# Patient Record
Sex: Male | Born: 1940 | Race: White | Hispanic: No | Marital: Married | Smoking: Never smoker
Health system: Southern US, Community
[De-identification: ages and names within clinical notes are randomized; demographics above are authoritative.]

## PROBLEM LIST (undated history)

## (undated) DIAGNOSIS — K2210 Ulcer of esophagus without bleeding: Secondary | ICD-10-CM

## (undated) DIAGNOSIS — M1990 Unspecified osteoarthritis, unspecified site: Secondary | ICD-10-CM

## (undated) DIAGNOSIS — H409 Unspecified glaucoma: Secondary | ICD-10-CM

## (undated) DIAGNOSIS — S2239XA Fracture of one rib, unspecified side, initial encounter for closed fracture: Secondary | ICD-10-CM

## (undated) DIAGNOSIS — I739 Peripheral vascular disease, unspecified: Secondary | ICD-10-CM

## (undated) DIAGNOSIS — I10 Essential (primary) hypertension: Secondary | ICD-10-CM

## (undated) DIAGNOSIS — K2950 Unspecified chronic gastritis without bleeding: Secondary | ICD-10-CM

## (undated) DIAGNOSIS — C801 Malignant (primary) neoplasm, unspecified: Secondary | ICD-10-CM

## (undated) DIAGNOSIS — R609 Edema, unspecified: Secondary | ICD-10-CM

## (undated) DIAGNOSIS — M109 Gout, unspecified: Secondary | ICD-10-CM

## (undated) DIAGNOSIS — E119 Type 2 diabetes mellitus without complications: Secondary | ICD-10-CM

## (undated) DIAGNOSIS — I639 Cerebral infarction, unspecified: Secondary | ICD-10-CM

## (undated) DIAGNOSIS — E785 Hyperlipidemia, unspecified: Secondary | ICD-10-CM

## (undated) DIAGNOSIS — K2980 Duodenitis without bleeding: Secondary | ICD-10-CM

## (undated) DIAGNOSIS — O2230 Deep phlebothrombosis in pregnancy, unspecified trimester: Secondary | ICD-10-CM

## (undated) DIAGNOSIS — E538 Deficiency of other specified B group vitamins: Secondary | ICD-10-CM

## (undated) DIAGNOSIS — K635 Polyp of colon: Secondary | ICD-10-CM

## (undated) DIAGNOSIS — N529 Male erectile dysfunction, unspecified: Secondary | ICD-10-CM

## (undated) DIAGNOSIS — S2249XA Multiple fractures of ribs, unspecified side, initial encounter for closed fracture: Secondary | ICD-10-CM

## (undated) HISTORY — PX: MOHS SURGERY: SHX181

## (undated) HISTORY — PX: CHOLECYSTECTOMY: SHX55

---

## 2004-05-29 ENCOUNTER — Ambulatory Visit: Payer: Self-pay

## 2006-07-10 ENCOUNTER — Ambulatory Visit: Payer: Self-pay | Admitting: Family Medicine

## 2008-02-18 ENCOUNTER — Ambulatory Visit: Payer: Self-pay | Admitting: Gastroenterology

## 2012-07-30 ENCOUNTER — Emergency Department: Payer: Self-pay | Admitting: Emergency Medicine

## 2012-07-30 ENCOUNTER — Ambulatory Visit: Payer: Self-pay | Admitting: Family Medicine

## 2012-07-30 LAB — CBC WITH DIFFERENTIAL/PLATELET
Basophil #: 0.1 10*3/uL (ref 0.0–0.1)
Basophil %: 0.7 %
Eosinophil %: 1.6 %
HCT: 42.2 % (ref 40.0–52.0)
HGB: 15 g/dL (ref 13.0–18.0)
Lymphocyte #: 1.3 10*3/uL (ref 1.0–3.6)
Monocyte #: 0.9 x10 3/mm (ref 0.2–1.0)
Platelet: 179 10*3/uL (ref 150–440)
RBC: 4.72 10*6/uL (ref 4.40–5.90)
RDW: 14.1 % (ref 11.5–14.5)

## 2012-07-30 LAB — COMPREHENSIVE METABOLIC PANEL
Albumin: 4.1 g/dL (ref 3.4–5.0)
Alkaline Phosphatase: 52 U/L (ref 50–136)
Calcium, Total: 9.6 mg/dL (ref 8.5–10.1)
Chloride: 100 mmol/L (ref 98–107)
Co2: 31 mmol/L (ref 21–32)
EGFR (Non-African Amer.): >60
Glucose: 189 mg/dL — ABNORMAL HIGH (ref 65–99)
SGOT(AST): 22 U/L (ref 15–37)
SGPT (ALT): 16 U/L (ref 12–78)

## 2012-07-30 LAB — APTT: Activated PTT: 23.4 secs — ABNORMAL LOW (ref 23.6–35.9)

## 2012-07-30 LAB — PROTIME-INR
INR: 1
Prothrombin Time: 13 secs (ref 11.5–14.7)

## 2014-12-21 ENCOUNTER — Other Ambulatory Visit
Admission: RE | Admit: 2014-12-21 | Discharge: 2014-12-21 | Disposition: A | Discharge status: Home / Self Care | Payer: Medicare Other | Source: Ambulatory Visit | Attending: Rheumatology | Admitting: Rheumatology

## 2014-12-21 DIAGNOSIS — M25561 Pain in right knee: Secondary | ICD-10-CM | POA: Insufficient documentation

## 2014-12-21 LAB — SYNOVIAL CELL COUNT + DIFF, W/ CRYSTALS
Eosinophils-Synovial: 0 %
Lymphocytes-Synovial Fld: 5 %
MONOCYTE-MACROPHAGE-SYNOVIAL FLUID: 4 %
Neutrophil, Synovial: 91 %
Other Cells-SYN: 0
WBC, SYNOVIAL: 1522 /mm3 — AB (ref 0–200)

## 2016-03-28 ENCOUNTER — Encounter: Payer: Self-pay | Admitting: *Deleted

## 2016-03-31 ENCOUNTER — Encounter
Admission: RE | Disposition: A | Discharge status: Home / Self Care | Payer: Self-pay | Source: Ambulatory Visit | Attending: Gastroenterology

## 2016-03-31 ENCOUNTER — Ambulatory Visit
Admission: RE | Admit: 2016-03-31 | Discharge: 2016-03-31 | Disposition: A | Discharge status: Home / Self Care | Payer: Medicare Other | Source: Ambulatory Visit | Attending: Gastroenterology | Admitting: Gastroenterology

## 2016-03-31 ENCOUNTER — Ambulatory Visit: Payer: Medicare Other | Admitting: Anesthesiology

## 2016-03-31 DIAGNOSIS — Z85828 Personal history of other malignant neoplasm of skin: Secondary | ICD-10-CM | POA: Diagnosis not present

## 2016-03-31 DIAGNOSIS — K2950 Unspecified chronic gastritis without bleeding: Secondary | ICD-10-CM | POA: Insufficient documentation

## 2016-03-31 DIAGNOSIS — Z8673 Personal history of transient ischemic attack (TIA), and cerebral infarction without residual deficits: Secondary | ICD-10-CM | POA: Insufficient documentation

## 2016-03-31 DIAGNOSIS — K210 Gastro-esophageal reflux disease with esophagitis: Secondary | ICD-10-CM | POA: Insufficient documentation

## 2016-03-31 DIAGNOSIS — K2210 Ulcer of esophagus without bleeding: Secondary | ICD-10-CM | POA: Insufficient documentation

## 2016-03-31 DIAGNOSIS — E785 Hyperlipidemia, unspecified: Secondary | ICD-10-CM | POA: Insufficient documentation

## 2016-03-31 DIAGNOSIS — Z7982 Long term (current) use of aspirin: Secondary | ICD-10-CM | POA: Diagnosis not present

## 2016-03-31 DIAGNOSIS — E1151 Type 2 diabetes mellitus with diabetic peripheral angiopathy without gangrene: Secondary | ICD-10-CM | POA: Diagnosis not present

## 2016-03-31 DIAGNOSIS — K3189 Other diseases of stomach and duodenum: Secondary | ICD-10-CM | POA: Insufficient documentation

## 2016-03-31 DIAGNOSIS — N529 Male erectile dysfunction, unspecified: Secondary | ICD-10-CM | POA: Diagnosis not present

## 2016-03-31 DIAGNOSIS — I10 Essential (primary) hypertension: Secondary | ICD-10-CM | POA: Insufficient documentation

## 2016-03-31 DIAGNOSIS — Z79899 Other long term (current) drug therapy: Secondary | ICD-10-CM | POA: Insufficient documentation

## 2016-03-31 DIAGNOSIS — Z86010 Personal history of colonic polyps: Secondary | ICD-10-CM | POA: Diagnosis not present

## 2016-03-31 DIAGNOSIS — Z7984 Long term (current) use of oral hypoglycemic drugs: Secondary | ICD-10-CM | POA: Insufficient documentation

## 2016-03-31 DIAGNOSIS — Z7901 Long term (current) use of anticoagulants: Secondary | ICD-10-CM | POA: Diagnosis not present

## 2016-03-31 DIAGNOSIS — K921 Melena: Secondary | ICD-10-CM | POA: Diagnosis present

## 2016-03-31 DIAGNOSIS — K2980 Duodenitis without bleeding: Secondary | ICD-10-CM | POA: Diagnosis not present

## 2016-03-31 HISTORY — DX: Malignant (primary) neoplasm, unspecified: C80.1

## 2016-03-31 HISTORY — DX: Type 2 diabetes mellitus without complications: E11.9

## 2016-03-31 HISTORY — DX: Hyperlipidemia, unspecified: E78.5

## 2016-03-31 HISTORY — DX: Cerebral infarction, unspecified: I63.9

## 2016-03-31 HISTORY — PX: ESOPHAGOGASTRODUODENOSCOPY: SHX5428

## 2016-03-31 HISTORY — DX: Male erectile dysfunction, unspecified: N52.9

## 2016-03-31 HISTORY — DX: Essential (primary) hypertension: I10

## 2016-03-31 HISTORY — DX: Unspecified osteoarthritis, unspecified site: M19.90

## 2016-03-31 HISTORY — DX: Deficiency of other specified B group vitamins: E53.8

## 2016-03-31 HISTORY — DX: Unspecified glaucoma: H40.9

## 2016-03-31 HISTORY — DX: Peripheral vascular disease, unspecified: I73.9

## 2016-03-31 HISTORY — PX: COLONOSCOPY WITH PROPOFOL: SHX5780

## 2016-03-31 LAB — CBC WITH DIFFERENTIAL/PLATELET
BASOS ABS: 0 10*3/uL (ref 0–0.1)
Basophils Relative: 0 %
Eosinophils Absolute: 0.1 10*3/uL (ref 0–0.7)
Eosinophils Relative: 1 %
HEMATOCRIT: 39.9 % — AB (ref 40.0–52.0)
Hemoglobin: 14.2 g/dL (ref 13.0–18.0)
LYMPHS ABS: 1.2 10*3/uL (ref 1.0–3.6)
LYMPHS PCT: 11 %
MCH: 33.2 pg (ref 26.0–34.0)
MCHC: 35.6 g/dL (ref 32.0–36.0)
MCV: 93.3 fL (ref 80.0–100.0)
Monocytes Absolute: 0.8 10*3/uL (ref 0.2–1.0)
Monocytes Relative: 8 %
NEUTROS ABS: 8.5 10*3/uL — AB (ref 1.4–6.5)
Neutrophils Relative %: 80 %
Platelets: 174 10*3/uL (ref 150–440)
RBC: 4.28 MIL/uL — AB (ref 4.40–5.90)
RDW: 15.2 % — ABNORMAL HIGH (ref 11.5–14.5)
WBC: 10.7 10*3/uL — ABNORMAL HIGH (ref 3.8–10.6)

## 2016-03-31 LAB — PROTIME-INR
INR: 0.96
Prothrombin Time: 12.8 seconds (ref 11.4–15.2)

## 2016-03-31 LAB — GLUCOSE, CAPILLARY: GLUCOSE-CAPILLARY: 141 mg/dL — AB (ref 65–99)

## 2016-03-31 SURGERY — EGD (ESOPHAGOGASTRODUODENOSCOPY)
Anesthesia: General

## 2016-03-31 MED ORDER — SODIUM CHLORIDE 0.9 % IV SOLN: INTRAVENOUS | Status: DC

## 2016-03-31 MED ORDER — PROPOFOL 500 MG/50ML IV EMUL
INTRAVENOUS | Status: DC | PRN
  Administered 2016-03-31: 150 ug/kg/min via INTRAVENOUS

## 2016-03-31 MED ORDER — PROPOFOL 10 MG/ML IV BOLUS
INTRAVENOUS | Status: DC | PRN
  Administered 2016-03-31 (×2): 30 mg via INTRAVENOUS
  Administered 2016-03-31: 20 mg via INTRAVENOUS

## 2016-03-31 MED ORDER — LIDOCAINE HCL (PF) 2 % IJ SOLN
INTRAMUSCULAR | Status: AC
  Filled 2016-03-31: qty 2

## 2016-03-31 MED ORDER — SODIUM CHLORIDE 0.9 % IV SOLN
INTRAVENOUS | Status: DC
  Administered 2016-03-31: 1000 mL via INTRAVENOUS

## 2016-03-31 MED ORDER — PROPOFOL 10 MG/ML IV BOLUS
INTRAVENOUS | Status: AC
  Filled 2016-03-31: qty 20

## 2016-03-31 NOTE — Op Note (Signed)
[facility] Gastroenterology Patient Name: Vincent Dawson Procedure Date: 03/31/2016 8:22 AM MRN: [id number] Account #: [id number] Date of Birth: [birth year] Admit Type: Outpatient Age: 76 Room: [facility] ENDO ROOM 3 Gender: Male Note Status: Finalized Procedure:            Upper GI endoscopy Indications:          Melena Providers:            Lollie Sails, MD Referring MD:         Gayland Curry MD, MD (Referring MD) Medicines:            Monitored Anesthesia Care Complications:        No immediate complications. Procedure:            Pre-Anesthesia Assessment:                       - ASA Grade Assessment: III - A patient with severe                        systemic disease.                       After obtaining informed consent, the endoscope was                        passed under direct vision. Throughout the procedure,                        the patient's blood pressure, pulse, and oxygen                        saturations were monitored continuously. The Endoscope                        was introduced through the mouth, and advanced to the                        third part of duodenum. The upper GI endoscopy was                        accomplished without difficulty. The patient tolerated                        the procedure. Findings:      LA Grade C (one or more mucosal breaks continuous between tops of 2 or       more mucosal folds, less than 75% circumference) esophagitis with no       bleeding was found. Biopsies were taken with a cold forceps for       histology.      Diffuse and patchy mild inflammation characterized by congestion (edema)       and erythema was found in the gastric body and in the gastric antrum.       Biopsies were taken with a cold forceps for histology. Biopsies were       taken with a cold forceps for Helicobacter pylori testing.      Patchy mild inflammation characterized by erosions and granularity was       found in the  duodenal bulb and in the second portion of the duodenum.      The cardia and gastric fundus were normal  on retroflexion.      A single localized, 4 mm non-bleeding erosion was found on the anterior       wall of the gastric body. There were no stigmata of recent bleeding. Impression:           - LA Grade C erosive esophagitis. Biopsied.                       - Gastritis. Biopsied.                       - Erosive duodenitis.                       - Non-bleeding erosive gastropathy. Recommendation:       - Use Protonix (pantoprazole) 40 mg PO daily daily.                       - Await pathology results.                       - Return to GI clinic in 4 weeks. Procedure Code(s):    --- Professional ---                       [phone], Esophagogastroduodenoscopy, flexible, transoral;                        with biopsy, single or multiple Diagnosis Code(s):    --- Professional ---                       K20.8, Other esophagitis                       K29.70, Gastritis, unspecified, without bleeding                       K29.80, Duodenitis without bleeding                       K31.89, Other diseases of stomach and duodenum                       K92.1, Melena (includes Hematochezia) CPT copyright 2016 [employer]. All rights reserved. The codes documented in this report are preliminary and upon coder review may  be revised to meet current compliance requirements. Lollie Sails, MD 03/31/2016 8:48:45 AM This report has been signed electronically. Number of Addenda: 0 Note Initiated On: 03/31/2016 8:22 AM      [facility]

## 2016-03-31 NOTE — Anesthesia Postprocedure Evaluation (Signed)
Anesthesia Post Note  Patient: Vincent Dawson  Procedure(s) Performed: Procedure(s) (LRB): ESOPHAGOGASTRODUODENOSCOPY (EGD) (N/A) COLONOSCOPY WITH PROPOFOL (N/A)  Patient location during evaluation: Endoscopy Anesthesia Type: General Level of consciousness: awake and alert Pain management: pain level controlled Vital Signs Assessment: post-procedure vital signs reviewed and stable Respiratory status: spontaneous breathing, nonlabored ventilation, respiratory function stable and patient connected to nasal cannula oxygen Cardiovascular status: blood pressure returned to baseline and stable Postop Assessment: no signs of nausea or vomiting Anesthetic complications: no     Last Vitals:  Vitals:   03/31/16 0924 03/31/16 0934  BP: 125/72 (!) 165/71  Pulse: 69 67  Resp: 12 18  Temp:      Last Pain:  Vitals:   03/31/16 0904  TempSrc: Tympanic                 Martha Clan

## 2016-03-31 NOTE — H&P (Signed)
Outpatient short stay form Pre-procedure 03/31/2016 8:15 AM Vincent Sails MD  Primary Physician: Dr. Gayland Curry  Reason for visit:  EGD and colonoscopy  History of present illness:  Patient is a 76 year old male presenting today as above. His personal history of adenomatous colon polyps and his last colonoscopy was in 2009. Ovaries also been reporting some black stools as well as what appears to be some rectal bleeding over the period past several months. He does take eliquis as well as a minidose aspirin. Both of these he is held since last Monday. He tolerated his prep well. He denies any other aspirin products or blood thinners.    Current Facility-Administered Medications:  .  0.9 %  sodium chloride infusion, , Intravenous, Continuous, Vincent Sails, MD, Last Rate: 20 mL/hr at 03/31/16 0812, 1,000 mL at 03/31/16 0812 .  0.9 %  sodium chloride infusion, , Intravenous, Continuous, Vincent Sails, MD  Prescriptions Prior to Admission  Medication Sig Dispense Refill Last Dose  . amLODipine (NORVASC) 10 MG tablet Take 10 mg by mouth daily.   03/30/2016 at Unknown time  . apixaban (ELIQUIS) 2.5 MG TABS tablet Take 2.5 mg by mouth 2 (two) times daily.   03/24/2016  . aspirin EC 81 MG tablet Take 81 mg by mouth daily.   03/24/2016  . atorvastatin (LIPITOR) 40 MG tablet Take 40 mg by mouth daily.   03/24/2016  . Cholecalciferol 2000 units CAPS Take 1 capsule by mouth daily.   03/30/2016 at Unknown time  . colchicine 0.6 MG tablet Take 0.6 mg by mouth daily. Take one po TID   03/24/2016  . gabapentin (NEURONTIN) 100 MG capsule Take 100 mg by mouth 3 (three) times daily.   03/24/2016  . glipiZIDE (GLUCOTROL) 5 MG tablet Take 5 mg by mouth daily before breakfast.   03/30/2016 at Unknown time  . metFORMIN (GLUCOPHAGE) 500 MG tablet Take 1,000 mg by mouth daily after supper.   03/30/2016 at Unknown time  . omega-3 acid ethyl esters (LOVAZA) 1 g capsule Take 1 g by mouth daily.   03/30/2016 at  Unknown time  . potassium chloride SA (K-DUR,KLOR-CON) 20 MEQ tablet Take 20 mEq by mouth daily.   03/24/2016  . sildenafil (VIAGRA) 100 MG tablet Take 100 mg by mouth daily as needed for erectile dysfunction.   Past Month at Unknown time  . allopurinol (ZYLOPRIM) 100 MG tablet Take 100 mg by mouth daily.   Not Taking at Unknown time  . cyanocobalamin 1000 MCG tablet Take 1,000 mcg by mouth daily.   Not Taking at Unknown time     No Known Allergies   Past Medical History:  Diagnosis Date  . Arthritis   . Cancer [facility])    Basil Cell Carcinoma  . Diabetes mellitus without complication ([place])   . Erectile dysfunction   . Glaucoma   . Hyperlipidemia   . Hypertension   . Peripheral vascular disease ([place])   . Stroke, small vessel (HCC)    right eye  . Vitamin B 12 deficiency     Review of systems:      Physical Exam    Heart and lungs: Regular rate and rhythm without rub or gallop, lungs are bilaterally clear.    HEENT: Normocephalic atraumatic eyes are anicteric    Other:     Pertinant exam for procedure: Off nontender nondistended bowel sounds positive normoactive.    Planned proceedures: EGD, Colonoscopy and indicated procedures. I have discussed the risks benefits  and complications of procedures to include not limited to bleeding, infection, perforation and the risk of sedation and the patient wishes to proceed.    Vincent Sails, MD Gastroenterology 03/31/2016  8:15 AM

## 2016-03-31 NOTE — Anesthesia Post-op Follow-up Note (Cosign Needed)
Anesthesia QCDR form completed.        

## 2016-03-31 NOTE — Transfer of Care (Signed)
Immediate Anesthesia Transfer of Care Note  Patient: Vincent Dawson  Procedure(s) Performed: Procedure(s): ESOPHAGOGASTRODUODENOSCOPY (EGD) (N/A) COLONOSCOPY WITH PROPOFOL (N/A)  Patient Location: PACU  Anesthesia Type:MAC  Level of Consciousness: awake  Airway & Oxygen Therapy: Patient Spontanous Breathing and Patient connected to nasal cannula oxygen  Post-op Assessment: Report given to RN and Post -op Vital signs reviewed and stable  Post vital signs: Reviewed and stable  Last Vitals:  Vitals:   03/31/16 0714 03/31/16 0904  BP: (!) 152/80 114/75  Pulse: 81 63  Resp: 20 15  Temp: 36.1 C 36.2 C    Last Pain:  Vitals:   03/31/16 0904  TempSrc: Tympanic         Complications: No apparent anesthesia complications

## 2016-03-31 NOTE — Anesthesia Preprocedure Evaluation (Signed)
Anesthesia Evaluation  Patient identified by MRN, date of birth, ID band Patient awake    Reviewed: Allergy & Precautions, H&P , NPO status , Patient's Chart, lab work & pertinent test results, reviewed documented beta blocker date and time   History of Anesthesia Complications Negative for: history of anesthetic complications  Airway Mallampati: III  TM Distance: >3 FB Neck ROM: full    Dental no notable dental hx. (+) Edentulous Upper, Edentulous Lower, Upper Dentures, Lower Dentures   Pulmonary neg pulmonary ROS,           Cardiovascular Exercise Tolerance: Good hypertension, (-) angina+ Peripheral Vascular Disease  (-) CAD, (-) Past MI, (-) Cardiac Stents and (-) CABG (-) dysrhythmias (-) Valvular Problems/Murmurs     Neuro/Psych neg Seizures CVA, Residual Symptoms negative psych ROS   GI/Hepatic negative GI ROS, Neg liver ROS,   Endo/Other  diabetes  Renal/GU negative Renal ROS  negative genitourinary   Musculoskeletal   Abdominal   Peds  Hematology negative hematology ROS (+)   Anesthesia Other Findings Past Medical History: No date: Arthritis No date: Cancer (HCC)     Comment: Basil Cell Carcinoma No date: Diabetes mellitus without complication (HCC) No date: Erectile dysfunction No date: Glaucoma No date: Hyperlipidemia No date: Hypertension No date: Peripheral vascular disease (HCC) No date: Stroke, small vessel (HCC)     Comment: right eye No date: Vitamin B 12 deficiency   Reproductive/Obstetrics negative OB ROS                             Anesthesia Physical Anesthesia Plan  ASA: II  Anesthesia Plan: General   Post-op Pain Management:    Induction:   Airway Management Planned:   Additional Equipment:   Intra-op Plan:   Post-operative Plan:   Informed Consent: I have reviewed the patients History and Physical, chart, labs and discussed the procedure  including the risks, benefits and alternatives for the proposed anesthesia with the patient or authorized representative who has indicated his/her understanding and acceptance.   Dental Advisory Given  Plan Discussed with: Anesthesiologist, CRNA and Surgeon  Anesthesia Plan Comments:         Anesthesia Quick Evaluation

## 2016-03-31 NOTE — Consult Note (Signed)
A she underwent his EGD. However on the colonoscopy on rectal examination was noted a thick brown stool present throughout the vault on examination and leaking out of the anal orifice. As such this prep will not be adequate and we will rearrange his colonoscopy procedure.

## 2016-04-01 ENCOUNTER — Encounter: Payer: Self-pay | Admitting: Gastroenterology

## 2016-04-01 LAB — SURGICAL PATHOLOGY

## 2016-05-13 ENCOUNTER — Other Ambulatory Visit: Payer: Self-pay | Admitting: Gastroenterology

## 2016-05-13 DIAGNOSIS — R1031 Right lower quadrant pain: Secondary | ICD-10-CM

## 2016-05-13 DIAGNOSIS — K529 Noninfective gastroenteritis and colitis, unspecified: Secondary | ICD-10-CM

## 2016-05-13 DIAGNOSIS — R1011 Right upper quadrant pain: Secondary | ICD-10-CM

## 2016-05-19 ENCOUNTER — Ambulatory Visit
Admission: RE | Admit: 2016-05-19 | Discharge: 2016-05-19 | Disposition: A | Discharge status: Home / Self Care | Payer: Medicare Other | Source: Ambulatory Visit | Attending: Gastroenterology | Admitting: Gastroenterology

## 2016-05-19 DIAGNOSIS — J9811 Atelectasis: Secondary | ICD-10-CM | POA: Diagnosis not present

## 2016-05-19 DIAGNOSIS — Z9049 Acquired absence of other specified parts of digestive tract: Secondary | ICD-10-CM | POA: Insufficient documentation

## 2016-05-19 DIAGNOSIS — R918 Other nonspecific abnormal finding of lung field: Secondary | ICD-10-CM | POA: Insufficient documentation

## 2016-05-19 DIAGNOSIS — I700 Atherosclerosis of aorta: Secondary | ICD-10-CM | POA: Insufficient documentation

## 2016-05-19 DIAGNOSIS — I2510 Atherosclerotic heart disease of native coronary artery without angina pectoris: Secondary | ICD-10-CM | POA: Diagnosis not present

## 2016-05-19 DIAGNOSIS — R1031 Right lower quadrant pain: Secondary | ICD-10-CM

## 2016-05-19 DIAGNOSIS — K529 Noninfective gastroenteritis and colitis, unspecified: Secondary | ICD-10-CM

## 2016-05-19 MED ORDER — IOPAMIDOL (ISOVUE-300) INJECTION 61%
125.0000 mL | Freq: Once | INTRAVENOUS | Status: AC | PRN
  Administered 2016-05-19: 125 mL via INTRAVENOUS

## 2016-10-30 ENCOUNTER — Other Ambulatory Visit
Admission: RE | Admit: 2016-10-30 | Discharge: 2016-10-30 | Disposition: A | Discharge status: Home / Self Care | Payer: Medicare Other | Source: Ambulatory Visit | Attending: Gastroenterology | Admitting: Gastroenterology

## 2016-10-30 DIAGNOSIS — R197 Diarrhea, unspecified: Secondary | ICD-10-CM | POA: Insufficient documentation

## 2016-10-30 LAB — GASTROINTESTINAL PANEL BY PCR, STOOL (REPLACES STOOL CULTURE)

## 2016-10-30 LAB — C DIFFICILE QUICK SCREEN W PCR REFLEX
C DIFFICILE (CDIFF) INTERP: NOT DETECTED
C DIFFICILE (CDIFF) TOXIN: NEGATIVE
C Diff antigen: NEGATIVE

## 2016-11-27 ENCOUNTER — Encounter: Payer: Self-pay | Admitting: *Deleted

## 2016-11-28 ENCOUNTER — Ambulatory Visit
Admission: RE | Admit: 2016-11-28 | Discharge: 2016-11-28 | Disposition: A | Discharge status: Home / Self Care | Payer: Medicare Other | Source: Ambulatory Visit | Attending: Gastroenterology | Admitting: Gastroenterology

## 2016-11-28 ENCOUNTER — Ambulatory Visit: Payer: Medicare Other | Admitting: Anesthesiology

## 2016-11-28 ENCOUNTER — Encounter
Admission: RE | Disposition: A | Discharge status: Home / Self Care | Payer: Self-pay | Source: Ambulatory Visit | Attending: Gastroenterology

## 2016-11-28 DIAGNOSIS — E1151 Type 2 diabetes mellitus with diabetic peripheral angiopathy without gangrene: Secondary | ICD-10-CM | POA: Diagnosis not present

## 2016-11-28 DIAGNOSIS — K529 Noninfective gastroenteritis and colitis, unspecified: Secondary | ICD-10-CM | POA: Insufficient documentation

## 2016-11-28 DIAGNOSIS — Z7901 Long term (current) use of anticoagulants: Secondary | ICD-10-CM | POA: Insufficient documentation

## 2016-11-28 DIAGNOSIS — E538 Deficiency of other specified B group vitamins: Secondary | ICD-10-CM | POA: Insufficient documentation

## 2016-11-28 DIAGNOSIS — Z8673 Personal history of transient ischemic attack (TIA), and cerebral infarction without residual deficits: Secondary | ICD-10-CM | POA: Diagnosis not present

## 2016-11-28 DIAGNOSIS — H409 Unspecified glaucoma: Secondary | ICD-10-CM | POA: Insufficient documentation

## 2016-11-28 DIAGNOSIS — Z86010 Personal history of colonic polyps: Secondary | ICD-10-CM | POA: Insufficient documentation

## 2016-11-28 DIAGNOSIS — D124 Benign neoplasm of descending colon: Secondary | ICD-10-CM | POA: Diagnosis not present

## 2016-11-28 DIAGNOSIS — K2950 Unspecified chronic gastritis without bleeding: Secondary | ICD-10-CM | POA: Diagnosis not present

## 2016-11-28 DIAGNOSIS — Z85828 Personal history of other malignant neoplasm of skin: Secondary | ICD-10-CM | POA: Insufficient documentation

## 2016-11-28 DIAGNOSIS — K635 Polyp of colon: Secondary | ICD-10-CM | POA: Insufficient documentation

## 2016-11-28 DIAGNOSIS — D130 Benign neoplasm of esophagus: Secondary | ICD-10-CM | POA: Diagnosis not present

## 2016-11-28 DIAGNOSIS — M1990 Unspecified osteoarthritis, unspecified site: Secondary | ICD-10-CM | POA: Diagnosis not present

## 2016-11-28 DIAGNOSIS — Z7984 Long term (current) use of oral hypoglycemic drugs: Secondary | ICD-10-CM | POA: Insufficient documentation

## 2016-11-28 DIAGNOSIS — K621 Rectal polyp: Secondary | ICD-10-CM | POA: Insufficient documentation

## 2016-11-28 DIAGNOSIS — I10 Essential (primary) hypertension: Secondary | ICD-10-CM | POA: Insufficient documentation

## 2016-11-28 DIAGNOSIS — K5730 Diverticulosis of large intestine without perforation or abscess without bleeding: Secondary | ICD-10-CM | POA: Diagnosis not present

## 2016-11-28 DIAGNOSIS — K219 Gastro-esophageal reflux disease without esophagitis: Secondary | ICD-10-CM | POA: Diagnosis not present

## 2016-11-28 DIAGNOSIS — E785 Hyperlipidemia, unspecified: Secondary | ICD-10-CM | POA: Diagnosis not present

## 2016-11-28 DIAGNOSIS — Z7982 Long term (current) use of aspirin: Secondary | ICD-10-CM | POA: Diagnosis not present

## 2016-11-28 DIAGNOSIS — D125 Benign neoplasm of sigmoid colon: Secondary | ICD-10-CM | POA: Diagnosis not present

## 2016-11-28 DIAGNOSIS — Z86718 Personal history of other venous thrombosis and embolism: Secondary | ICD-10-CM | POA: Insufficient documentation

## 2016-11-28 DIAGNOSIS — D120 Benign neoplasm of cecum: Secondary | ICD-10-CM | POA: Diagnosis not present

## 2016-11-28 DIAGNOSIS — Z79899 Other long term (current) drug therapy: Secondary | ICD-10-CM | POA: Insufficient documentation

## 2016-11-28 DIAGNOSIS — D123 Benign neoplasm of transverse colon: Secondary | ICD-10-CM | POA: Diagnosis not present

## 2016-11-28 DIAGNOSIS — N529 Male erectile dysfunction, unspecified: Secondary | ICD-10-CM | POA: Insufficient documentation

## 2016-11-28 DIAGNOSIS — M109 Gout, unspecified: Secondary | ICD-10-CM | POA: Insufficient documentation

## 2016-11-28 HISTORY — DX: Deep phlebothrombosis in pregnancy, unspecified trimester: O22.30

## 2016-11-28 HISTORY — PX: ESOPHAGOGASTRODUODENOSCOPY (EGD) WITH PROPOFOL: SHX5813

## 2016-11-28 HISTORY — PX: COLONOSCOPY WITH PROPOFOL: SHX5780

## 2016-11-28 HISTORY — DX: Unspecified chronic gastritis without bleeding: K29.50

## 2016-11-28 HISTORY — DX: Ulcer of esophagus without bleeding: K22.10

## 2016-11-28 HISTORY — DX: Duodenitis without bleeding: K29.80

## 2016-11-28 HISTORY — DX: Polyp of colon: K63.5

## 2016-11-28 HISTORY — DX: Fracture of one rib, unspecified side, initial encounter for closed fracture: S22.39XA

## 2016-11-28 HISTORY — DX: Gout, unspecified: M10.9

## 2016-11-28 HISTORY — DX: Multiple fractures of ribs, unspecified side, initial encounter for closed fracture: S22.49XA

## 2016-11-28 LAB — GLUCOSE, CAPILLARY: Glucose-Capillary: 234 mg/dL — ABNORMAL HIGH (ref 65–99)

## 2016-11-28 SURGERY — COLONOSCOPY WITH PROPOFOL
Anesthesia: General

## 2016-11-28 MED ORDER — PROPOFOL 500 MG/50ML IV EMUL
INTRAVENOUS | Status: DC | PRN
  Administered 2016-11-28: 75 ug/kg/min via INTRAVENOUS

## 2016-11-28 MED ORDER — SODIUM CHLORIDE 0.9 % IV SOLN: INTRAVENOUS | Status: DC

## 2016-11-28 MED ORDER — LIDOCAINE HCL (PF) 1 % IJ SOLN
2.0000 mL | Freq: Once | INTRAMUSCULAR | Status: AC
  Administered 2016-11-28: 0.3 mL via INTRADERMAL

## 2016-11-28 MED ORDER — SODIUM CHLORIDE 0.9 % IV SOLN
INTRAVENOUS | Status: DC
  Administered 2016-11-28: 08:00:00 via INTRAVENOUS
  Administered 2016-11-28: 1000 mL via INTRAVENOUS

## 2016-11-28 MED ORDER — PROPOFOL 500 MG/50ML IV EMUL
INTRAVENOUS | Status: AC
  Filled 2016-11-28: qty 50

## 2016-11-28 MED ORDER — PROPOFOL 10 MG/ML IV BOLUS
INTRAVENOUS | Status: DC | PRN
  Administered 2016-11-28: 50 mg via INTRAVENOUS
  Administered 2016-11-28: 20 mg via INTRAVENOUS
  Administered 2016-11-28: 50 mg via INTRAVENOUS
  Administered 2016-11-28: 80 mg via INTRAVENOUS

## 2016-11-28 MED ORDER — LIDOCAINE HCL (PF) 1 % IJ SOLN
INTRAMUSCULAR | Status: AC
  Administered 2016-11-28: 0.3 mL via INTRADERMAL
  Filled 2016-11-28: qty 2

## 2016-11-28 NOTE — H&P (Signed)
Outpatient short stay form Pre-procedure 11/28/2016 7:39 AM Lollie Sails MD  Primary Physician: Dr. Gayland Curry  Reason for visit:  EGD and colonoscopy  History of present illness:  Patient is a 76 year old male presenting today as above. Is a personal history of an episode of melena with a finding of severe esophagitis on a EGD in January of this year. He is presenting for follow-up in that regard as well as colonoscopy for personal history of adenomatous colon polyps. He does take apixiban which she has held for about 3-4 days. He takes no other aspirin product or blood thinning agent. He is not on a proton pump inhibitor currently    Current Facility-Administered Medications:  .  0.9 %  sodium chloride infusion, , Intravenous, Continuous, Lollie Sails, MD, Last Rate: 20 mL/hr at 11/28/16 0734, 1,000 mL at 11/28/16 0734 .  0.9 %  sodium chloride infusion, , Intravenous, Continuous, Lollie Sails, MD  Prescriptions Prior to Admission  Medication Sig Dispense Refill Last Dose  . allopurinol (ZYLOPRIM) 100 MG tablet Take 100 mg by mouth daily.   Not Taking at Unknown time  . amLODipine (NORVASC) 10 MG tablet Take 10 mg by mouth daily.   03/30/2016 at Unknown time  . apixaban (ELIQUIS) 2.5 MG TABS tablet Take 2.5 mg by mouth 2 (two) times daily.   03/24/2016  . aspirin EC 81 MG tablet Take 81 mg by mouth daily.   11/24/2016  . atorvastatin (LIPITOR) 40 MG tablet Take 40 mg by mouth daily.   03/24/2016  . Cholecalciferol 2000 units CAPS Take 1 capsule by mouth daily.   03/30/2016 at Unknown time  . colchicine 0.6 MG tablet Take 0.6 mg by mouth daily. Take one po TID   03/24/2016  . cyanocobalamin 1000 MCG tablet Take 1,000 mcg by mouth daily.   Not Taking at Unknown time  . gabapentin (NEURONTIN) 100 MG capsule Take 100 mg by mouth 3 (three) times daily.   03/24/2016  . glipiZIDE (GLUCOTROL) 5 MG tablet Take 5 mg by mouth daily before breakfast.   03/30/2016 at Unknown time  .  metFORMIN (GLUCOPHAGE) 500 MG tablet Take 1,000 mg by mouth daily after supper.   03/30/2016 at Unknown time  . omega-3 acid ethyl esters (LOVAZA) 1 g capsule Take 1 g by mouth daily.   03/30/2016 at Unknown time  . potassium chloride SA (K-DUR,KLOR-CON) 20 MEQ tablet Take 20 mEq by mouth daily.   03/24/2016  . sildenafil (VIAGRA) 100 MG tablet Take 100 mg by mouth daily as needed for erectile dysfunction.   Past Month at Unknown time     No Known Allergies   Past Medical History:  Diagnosis Date  . Arthritis   . Cancer [facility])    Basil Cell Carcinoma  . Colon polyp   . Diabetes mellitus without complication ([place])   . Duodenitis   . DVT (deep vein thrombosis) in pregnancy ([place])   . Erectile dysfunction   . Erosive esophagitis   . Gastritis, chronic   . Glaucoma   . Gout   . Hyperlipidemia   . Hypertension   . Peripheral vascular disease ([place])   . Rib fractures   . Stroke, small vessel (HCC)    right eye  . Vitamin B 12 deficiency     Review of systems:      Physical Exam    Heart and lungs: Regular rate and rhythm without rub or gallop, lungs are bilaterally clear.  HEENT: Normocephalic atraumatic eyes are anicteric    Other:     Pertinant exam for procedure: Soft nontender nondistended bowel sounds positive normoactive    Planned proceedures: EGD and colonoscopy with indicated procedures. I have discussed the risks benefits and complications of procedures to include not limited to bleeding, infection, perforation and the risk of sedation and the patient wishes to proceed.    Lollie Sails, MD Gastroenterology 11/28/2016  7:39 AM

## 2016-11-28 NOTE — Op Note (Signed)
[facility] Gastroenterology Patient Name: Gregary Blackard Procedure Date: 11/28/2016 7:41 AM MRN: [id number] Account #: [id number] Date of Birth: 08/17/1940 Admit Type: Outpatient Age: 76 Room: [facility] ENDO ROOM 1 Gender: Male Note Status: Finalized Procedure:            Upper GI endoscopy Indications:          Follow-up of esophagitis Providers:            Lollie Sails, MD Referring MD:         Gayland Curry MD, MD (Referring MD) Medicines:            Monitored Anesthesia Care Complications:        No immediate complications. Procedure:            Pre-Anesthesia Assessment:                       - ASA Grade Assessment: III - A patient with severe                        systemic disease.                       After obtaining informed consent, the endoscope was                        passed under direct vision. Throughout the procedure,                        the patient's blood pressure, pulse, and oxygen                        saturations were monitored continuously. The Endoscope                        was introduced through the mouth, and advanced to the                        third part of duodenum. The upper GI endoscopy was                        accomplished without difficulty. The patient tolerated                        the procedure well. Findings:      The Z-line was variable. Biopsies were taken with a cold forceps for       histology. No gross evidence of esophagitis.      Patchy mild inflammation characterized by erosions and erythema was       found in the gastric antrum and in the prepyloric region of the stomach.       Biopsies were taken with a cold forceps for histology. Biopsies were       taken with a cold forceps for Helicobacter pylori testing.      The exam of the stomach was otherwise normal.      The cardia and gastric fundus were normal on retroflexion.      The examined duodenum was normal.      papillary lesion at 26 cm,  Biopsies/removal were taken with a cold       forceps for histology.      The exam of the esophagus  was otherwise normal. Impression:           - Z-line variable. Biopsied.                       - Erosive gastritis. Biopsied.                       - Normal examined duodenum. Recommendation:       - Await pathology results. Procedure Code(s):    --- Professional ---                       [phone], Esophagogastroduodenoscopy, flexible, transoral;                        with biopsy, single or multiple Diagnosis Code(s):    --- Professional ---                       K22.8, Other specified diseases of esophagus                       K29.60, Other gastritis without bleeding                       K20.9, Esophagitis, unspecified CPT copyright 2016 American Medical Association. All rights reserved. The codes documented in this report are preliminary and upon coder review may  be revised to meet current compliance requirements. Lollie Sails, MD 11/28/2016 8:12:04 AM This report has been signed electronically. Number of Addenda: 0 Note Initiated On: 11/28/2016 7:41 AM      [facility]

## 2016-11-28 NOTE — Op Note (Signed)
[facility] Gastroenterology Patient Name: Vincent Dawson Procedure Date: 11/28/2016 7:40 AM MRN: [id number] Account #: [id number] Date of Birth: February 06, 1941 Admit Type: Outpatient Age: 76 Room: [facility] ENDO ROOM 1 Gender: Male Note Status: Finalized Procedure:            Colonoscopy Indications:          Chronic diarrhea, Personal history of colonic polyps Providers:            Lollie Sails, MD Referring MD:         Gayland Curry MD, MD (Referring MD) Medicines:            Monitored Anesthesia Care Complications:        No immediate complications. Procedure:            Pre-Anesthesia Assessment:                       - ASA Grade Assessment: III - A patient with severe                        systemic disease.                       After obtaining informed consent, the colonoscope was                        passed under direct vision. Throughout the procedure,                        the patient's blood pressure, pulse, and oxygen                        saturations were monitored continuously. The                        Colonoscope was introduced through the anus and                        advanced to the the cecum, identified by appendiceal                        orifice and ileocecal valve. The colonoscopy was                        performed with moderate difficulty due to poor bowel                        prep. Successful completion of the procedure was aided                        by lavage. The quality of the bowel preparation was                        fair. Findings:      Multiple small-mouthed diverticula were found in the sigmoid colon and       descending colon.      A 3 mm polyp was found in the transverse colon. The polyp was sessile.       The polyp was removed with a cold biopsy forceps. Resection and       retrieval were complete.      A 4 mm  polyp was found in the ileocecal valve. The polyp was sessile.       The polyp was removed with a cold  snare. Resection and retrieval were       complete.      A 2 mm polyp was found in the proximal descending colon. The polyp was       sessile. The polyp was removed with a cold biopsy forceps. Resection and       retrieval were complete.      Four sessile polyps were found in the mid descending colon. The polyps       were 1 to 3 mm in size. These polyps were removed with a cold biopsy       forceps. Resection and retrieval were complete.      A 7 mm polyp was found in the mid descending colon. The polyp was       pedunculated. The polyp was removed with a cold snare. Resection and       retrieval were complete.      A 5 mm polyp was found in the proximal sigmoid colon. The polyp was       pedunculated. The polyp was removed with a cold snare. Resection and       retrieval were complete.      A 2 mm polyp was found in the mid sigmoid colon. The polyp was sessile.       The polyp was removed with a cold biopsy forceps. Resection and       retrieval were complete.      Two sessile polyps were found in the rectum. The polyps were 1 to 2 mm       in size. These polyps were removed with a cold biopsy forceps. Resection       and retrieval were complete.      The digital rectal exam was normal.      The retroflexed view of the distal rectum and anal verge was normal and       showed no anal or rectal abnormalities.      Biopsies for histology were taken with a cold forceps from the right       colon and left colon for evaluation of microscopic colitis. Impression:           - Preparation of the colon was fair.                       - Diverticulosis in the sigmoid colon and in the                        descending colon.                       - One 3 mm polyp in the transverse colon, removed with                        a cold biopsy forceps. Resected and retrieved.                       - One 4 mm polyp at the ileocecal valve, removed with a                        cold snare. Resected and  retrieved.                       -  One 2 mm polyp in the proximal descending colon,                        removed with a cold biopsy forceps. Resected and                        retrieved.                       - Four 1 to 3 mm polyps in the mid descending colon,                        removed with a cold biopsy forceps. Resected and                        retrieved.                       - One 7 mm polyp in the mid descending colon, removed                        with a cold snare. Resected and retrieved.                       - One 5 mm polyp in the proximal sigmoid colon, removed                        with a cold snare. Resected and retrieved.                       - One 2 mm polyp in the mid sigmoid colon, removed with                        a cold biopsy forceps. Resected and retrieved.                       - Two 1 to 2 mm polyps in the rectum, removed with a                        cold biopsy forceps. Resected and retrieved.                       - The distal rectum and anal verge are normal on                        retroflexion view. Recommendation:       - Return to GI clinic in 1 month. Procedure Code(s):    --- Professional ---                       [phone], Colonoscopy, flexible; with removal of tumor(s),                        polyp(s), or other lesion(s) by snare technique                       48185, 59, Colonoscopy, flexible; with biopsy, single                        or multiple  Diagnosis Code(s):    --- Professional ---                       D12.3, Benign neoplasm of transverse colon (hepatic                        flexure or splenic flexure)                       D12.0, Benign neoplasm of cecum                       D12.4, Benign neoplasm of descending colon                       D12.5, Benign neoplasm of sigmoid colon                       K62.1, Rectal polyp                       K52.9, Noninfective gastroenteritis and colitis,                        unspecified                        Z86.010, Personal history of colonic polyps                       K57.30, Diverticulosis of large intestine without                        perforation or abscess without bleeding CPT copyright 2016 American Medical Association. All rights reserved. The codes documented in this report are preliminary and upon coder review may  be revised to meet current compliance requirements. Lollie Sails, MD 11/28/2016 9:01:30 AM This report has been signed electronically. Number of Addenda: 0 Note Initiated On: 11/28/2016 7:40 AM Scope Withdrawal Time: 0 hours 22 minutes 9 seconds  Total Procedure Duration: 0 hours 37 minutes 45 seconds       Erlanger Bledsoe

## 2016-11-28 NOTE — Anesthesia Preprocedure Evaluation (Signed)
Anesthesia Evaluation  Patient identified by MRN, date of birth, ID band Patient awake    Reviewed: Allergy & Precautions, H&P , NPO status , Patient's Chart, lab work & pertinent test results, reviewed documented beta blocker date and time   Airway Mallampati: II   Neck ROM: full    Dental  (+) Poor Dentition, Teeth Intact   Pulmonary neg pulmonary ROS,    Pulmonary exam normal        Cardiovascular Exercise Tolerance: Poor hypertension, On Medications + Peripheral Vascular Disease  Normal cardiovascular exam Rhythm:regular Rate:Normal     Neuro/Psych CVA negative neurological ROS  negative psych ROS   GI/Hepatic Neg liver ROS, PUD,   Endo/Other  diabetes, Well Controlled, Type 2, Oral Hypoglycemic Agents  Renal/GU negative Renal ROS  negative genitourinary   Musculoskeletal   Abdominal   Peds  Hematology negative hematology ROS (+)   Anesthesia Other Findings Past Medical History: No date: Arthritis No date: Cancer ([place])     Comment:  Basil Cell Carcinoma No date: Colon polyp No date: Diabetes mellitus without complication (HCC) No date: Duodenitis No date: DVT (deep vein thrombosis) in pregnancy ([place]) No date: Erectile dysfunction No date: Erosive esophagitis No date: Gastritis, chronic No date: Glaucoma No date: Gout No date: Hyperlipidemia No date: Hypertension No date: Peripheral vascular disease (HCC) No date: Rib fractures No date: Stroke, small vessel (HCC)     Comment:  right eye No date: Vitamin B 12 deficiency Past Surgical History: 03/31/2016: COLONOSCOPY WITH PROPOFOL; N/A     Comment:  Procedure: COLONOSCOPY WITH PROPOFOL;  Surgeon: Lollie Sails, MD;  Location[facility] ENDOSCOPY;  Service:               Endoscopy;  Laterality: N/A; 03/31/2016: ESOPHAGOGASTRODUODENOSCOPY; N/A     Comment:  Procedure: ESOPHAGOGASTRODUODENOSCOPY (EGD);  Surgeon:               Lollie Sails, MD;  Location: [facility] ENDOSCOPY;                Service: Endoscopy;  Laterality: N/A; No date: MOHS SURGERY BMI    Body Mass Index:  29.05 kg/m     Reproductive/Obstetrics negative OB ROS                             Anesthesia Physical Anesthesia Plan  ASA: III  Anesthesia Plan: General   Post-op Pain Management:    Induction:   PONV Risk Score and Plan:   Airway Management Planned:   Additional Equipment:   Intra-op Plan:   Post-operative Plan:   Informed Consent: I have reviewed the patients History and Physical, chart, labs and discussed the procedure including the risks, benefits and alternatives for the proposed anesthesia with the patient or authorized representative who has indicated his/her understanding and acceptance.   Dental Advisory Given  Plan Discussed with: CRNA  Anesthesia Plan Comments:         Anesthesia Quick Evaluation

## 2016-11-28 NOTE — Anesthesia Post-op Follow-up Note (Signed)
Anesthesia QCDR form completed.        

## 2016-11-28 NOTE — Transfer of Care (Signed)
Immediate Anesthesia Transfer of Care Note  Patient: Vincent Dawson  Procedure(s) Performed: Procedure(s): COLONOSCOPY WITH PROPOFOL (N/A) ESOPHAGOGASTRODUODENOSCOPY (EGD) WITH PROPOFOL (N/A)  Patient Location: PACU  Anesthesia Type:  Level of Consciousness: sedated  Airway & Oxygen Therapy: Patient Spontanous Breathing  Post-op Assessment: Report given to RN and Post -op Vital signs reviewed and stable  Post vital signs: Reviewed, stable and unstable  Last Vitals:  Vitals:   11/28/16 0714 11/28/16 0912  BP: (!) 151/91 128/85  Pulse: 91 75  Resp: 17 20  Temp: 36.4 C (!) 36.1 C  SpO2: 99% 98%    Last Pain:  Vitals:   11/28/16 0912  TempSrc: Tympanic         Complications: No apparent anesthesia complications

## 2016-12-01 ENCOUNTER — Encounter: Payer: Self-pay | Admitting: Gastroenterology

## 2016-12-01 LAB — SURGICAL PATHOLOGY

## 2016-12-01 NOTE — Anesthesia Postprocedure Evaluation (Signed)
Anesthesia Post Note  Patient: GERRETT LOMAN  Procedure(s) Performed: Procedure(s) (LRB): COLONOSCOPY WITH PROPOFOL (N/A) ESOPHAGOGASTRODUODENOSCOPY (EGD) WITH PROPOFOL (N/A)  Patient location during evaluation: PACU Anesthesia Type: General Level of consciousness: awake and alert Pain management: pain level controlled Vital Signs Assessment: post-procedure vital signs reviewed and stable Respiratory status: spontaneous breathing, nonlabored ventilation, respiratory function stable and patient connected to nasal cannula oxygen Cardiovascular status: blood pressure returned to baseline and stable Postop Assessment: no apparent nausea or vomiting Anesthetic complications: no     Last Vitals:  Vitals:   11/28/16 0932 11/28/16 0942  BP: 129/84 125/83  Pulse: 75 74  Resp: 18 19  Temp:    SpO2: 97% 96%    Last Pain:  Vitals:   11/28/16 0912  TempSrc: Tympanic                 Molli Barrows

## 2017-03-20 ENCOUNTER — Other Ambulatory Visit: Payer: Self-pay | Admitting: Family Medicine

## 2017-03-20 DIAGNOSIS — R911 Solitary pulmonary nodule: Secondary | ICD-10-CM

## 2017-04-06 ENCOUNTER — Ambulatory Visit
Admission: RE | Admit: 2017-04-06 | Discharge: 2017-04-06 | Disposition: A | Discharge status: Home / Self Care | Payer: Medicare Other | Source: Ambulatory Visit | Attending: Family Medicine | Admitting: Family Medicine

## 2017-04-06 DIAGNOSIS — R911 Solitary pulmonary nodule: Secondary | ICD-10-CM | POA: Diagnosis present

## 2017-04-06 DIAGNOSIS — R918 Other nonspecific abnormal finding of lung field: Secondary | ICD-10-CM | POA: Diagnosis not present

## 2017-04-06 DIAGNOSIS — E119 Type 2 diabetes mellitus without complications: Secondary | ICD-10-CM | POA: Insufficient documentation

## 2017-04-06 DIAGNOSIS — I2510 Atherosclerotic heart disease of native coronary artery without angina pectoris: Secondary | ICD-10-CM | POA: Insufficient documentation

## 2017-04-06 DIAGNOSIS — I712 Thoracic aortic aneurysm, without rupture: Secondary | ICD-10-CM | POA: Insufficient documentation

## 2017-04-06 DIAGNOSIS — I700 Atherosclerosis of aorta: Secondary | ICD-10-CM | POA: Insufficient documentation

## 2017-04-06 DIAGNOSIS — J9811 Atelectasis: Secondary | ICD-10-CM | POA: Diagnosis not present

## 2017-04-07 ENCOUNTER — Ambulatory Visit: Admission: RE | Admit: 2017-04-07 | Payer: Medicare Other | Source: Ambulatory Visit

## 2018-04-21 IMAGING — CT CT CHEST W/O CM
2 of 3 series · 15 of 36 positions shown, 18 images · non-contrast
Comparison: 05/19/2016

CLINICAL DATA: Follow-up lung nodule seen on a prior abdominal CT.

EXAM:
CT CHEST WITHOUT CONTRAST
TECHNIQUE: Multidetector CT imaging of the chest was performed following the
standard protocol without IV contrast.

[Series 2: thorax · axial · 0.71mm/px · z∈[-530,-278]mm · 12 of 150 slices shown, 15 images]
[im 12/150  mediastinal]
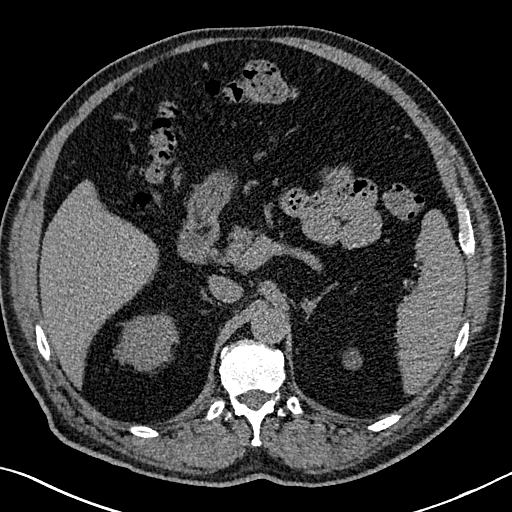
[im 12/150  lung]
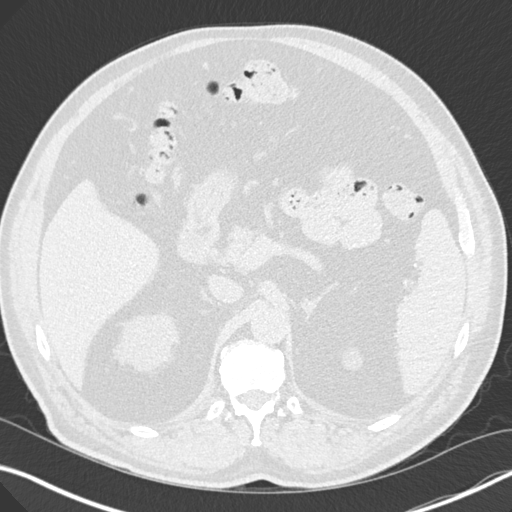
[im 23/150  lung]
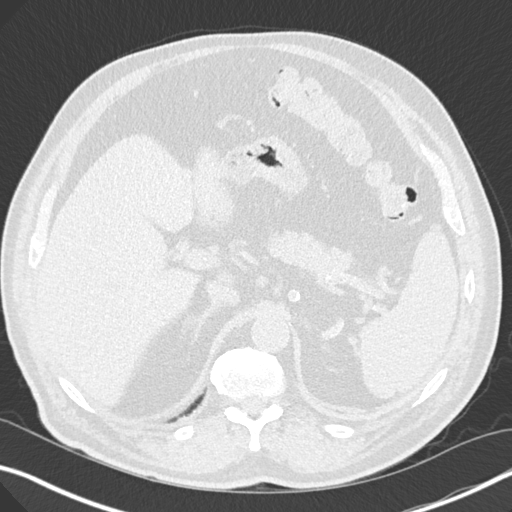
[im 34/150  lung]
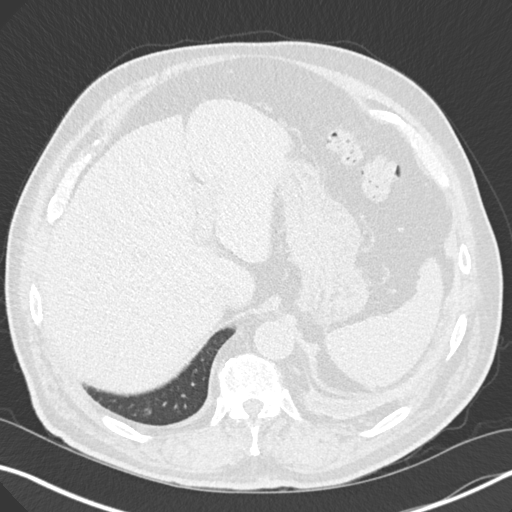
[im 45/150  lung]
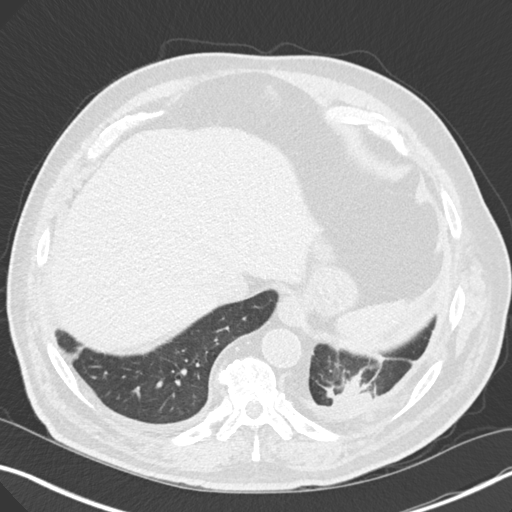
[im 56/150  mediastinal]
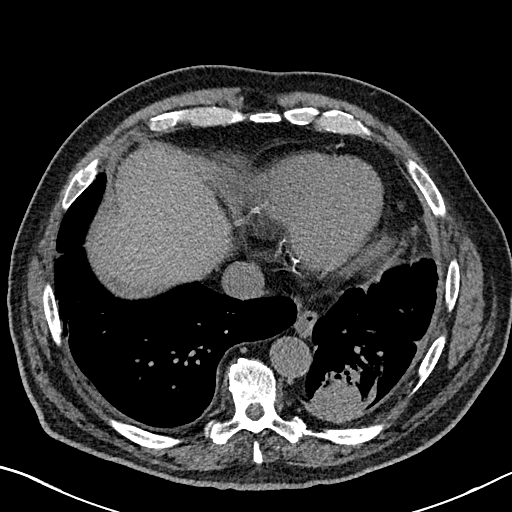
[im 56/150  lung]
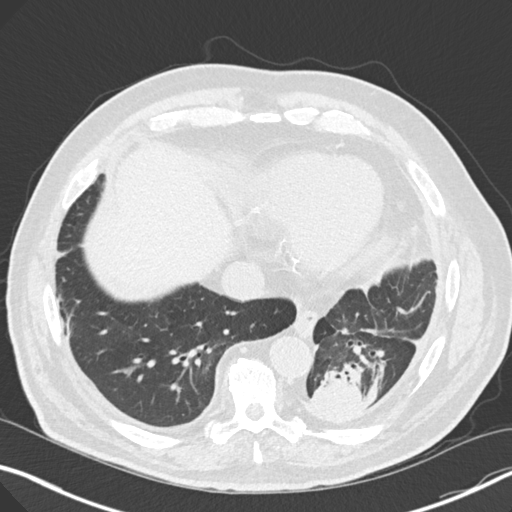
[im 67/150  lung]
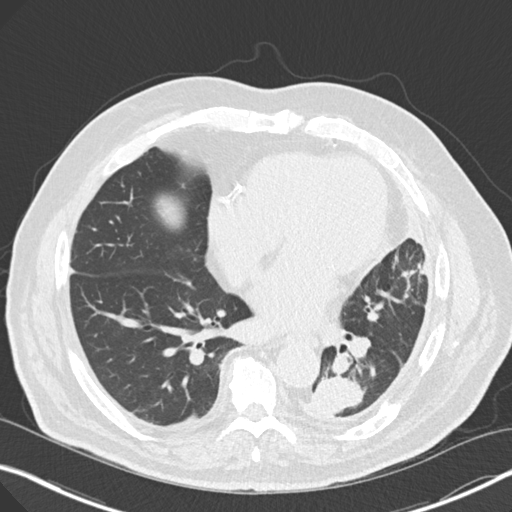
[im 83/150  lung]
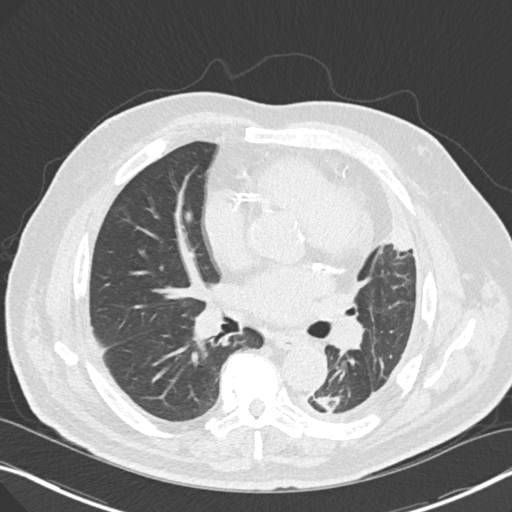
[im 94/150  lung]
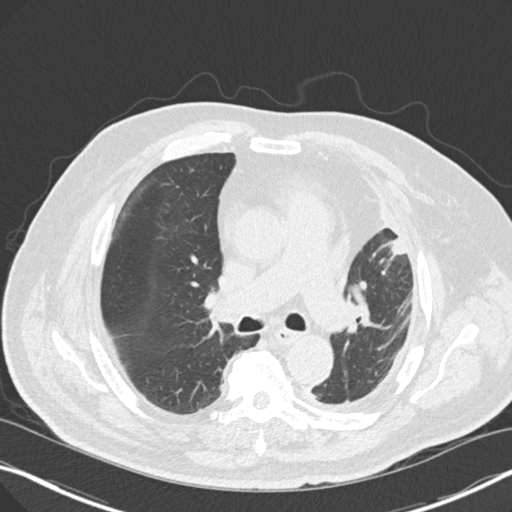
[im 105/150  mediastinal]
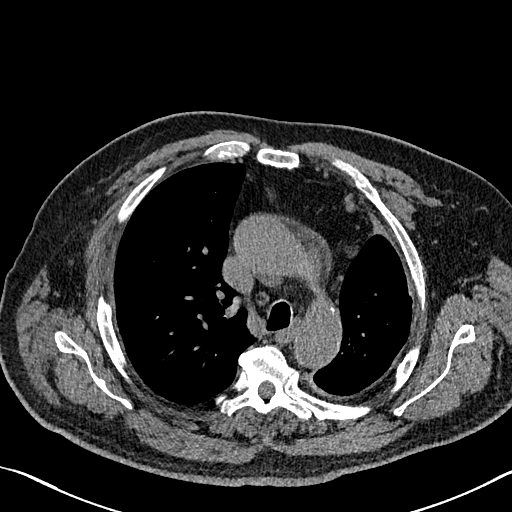
[im 105/150  lung]
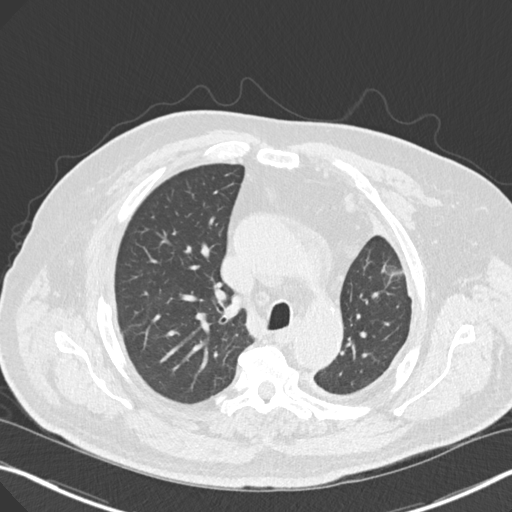
[im 116/150  lung]
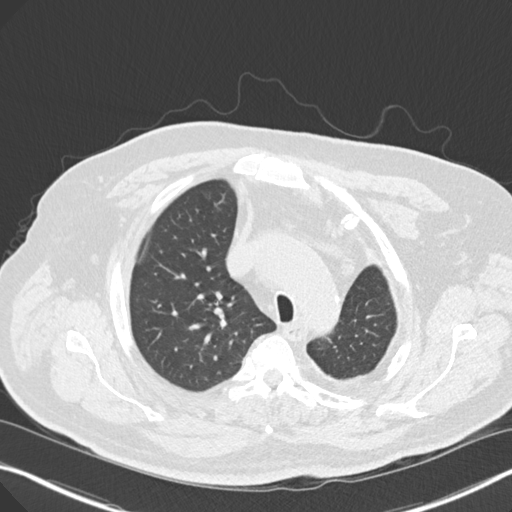
[im 127/150  lung]
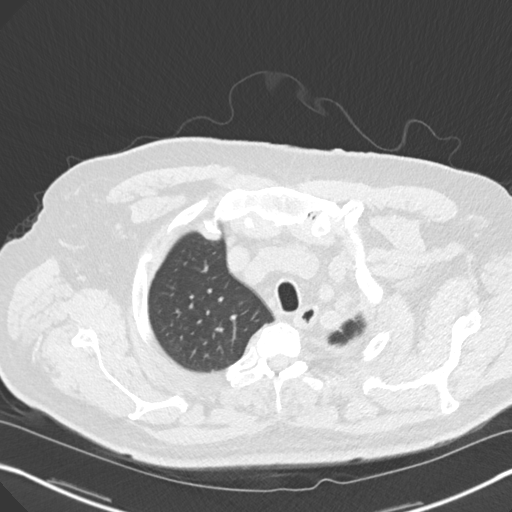
[im 138/150  lung]
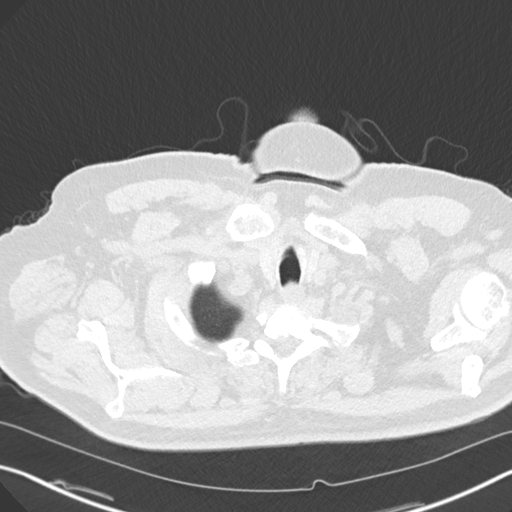

[Series 5: coronal · coronal · 0.66mm/px · 3 of 170 slices shown]
[im 34/170  lung]
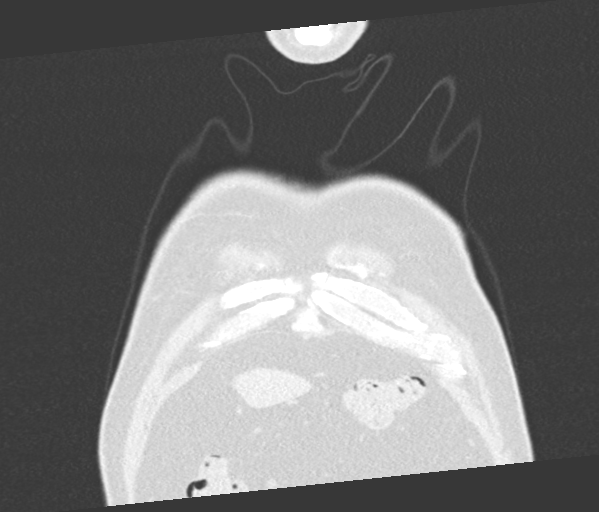
[im 68/170  lung]
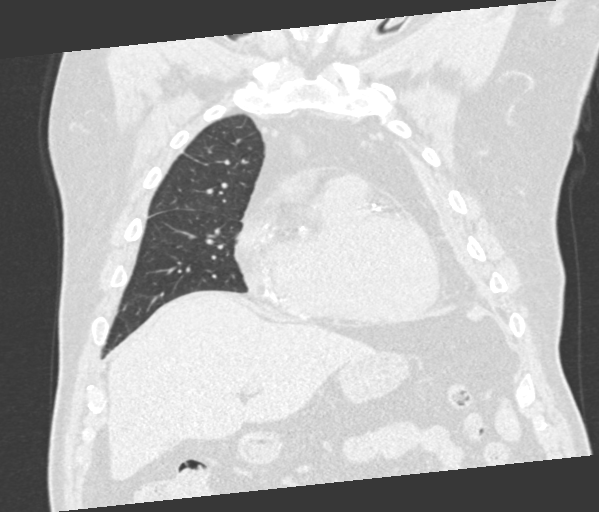
[im 102/170  lung]
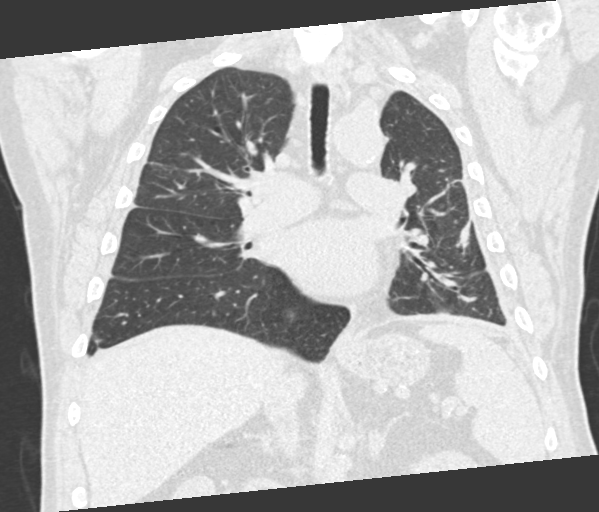

[15 of 36 positions shown; findings below may reference images not displayed]

FINDINGS: Cardiovascular: Thoracic aortic atherosclerosis. Mild aneurysmal
dilatation of the ascending aorta to 4.0 cm maximal diameter.
Extensive coronary artery atherosclerosis including left main
disease. Mild cardiomegaly. No pericardial effusion.

Mediastinum/Nodes: Scattered borderline to mildly enlarged
mediastinal lymph nodes, the largest measuring 1.2 cm in short axis
in the prevascular region. Grossly unremarkable thyroid and
esophagus.

Lungs/Pleura: Chronic left-sided pleural thickening and trace
pleural fluid, similar to was partially imaged on the prior
abdominal CT. Unchanged round atelectasis in the left lower lobe and
round atelectasis and scarring in the lingula. Unchanged 5 mm right
lower lobe nodule (series 3, image 90). Mild focal pleural
thickening along the right minor fissure. Mild right upper lobe
scarring.

Upper Abdomen: Status post cholecystectomy. 2 cm left upper pole
renal cyst, slightly increased in size. Abdominal aortic and branch
vessel atherosclerosis.

Musculoskeletal: Old bilateral rib fractures. Thoracic spondylosis.
No suspicious osseous lesion.
IMPRESSION: 1. Unchanged 5 mm right lower lobe lung nodule.
2. Similar appearance of chronic pleural thickening, trace fluid,
and round atelectasis in the left lung base.
3. 4.0 cm ascending aortic aneurysm. Recommend annual imaging
followup by CTA or MRA. This recommendation follows 8626
ACCF/AHA/AATS/ACR/ASA/SCA/SPARESORT/MONSALVE/KAHOVERE/BRACK TIGER Guidelines for the
Diagnosis and Management of Patients with Thoracic Aortic Disease.
Circulation. 8626; 121: [id number]
4. Aortic Atherosclerosis (9WSOA-AT0.0). Aortic Atherosclerosis
(9WSOA-AT0.0).
5. Extensive coronary artery atherosclerosis.

## 2018-04-28 ENCOUNTER — Encounter: Payer: Self-pay | Admitting: *Deleted

## 2018-05-04 ENCOUNTER — Ambulatory Visit: Admit: 2018-05-04 | Payer: Medicare Other | Admitting: Ophthalmology

## 2018-05-04 SURGERY — PHACOEMULSIFICATION, CATARACT, WITH IOL INSERTION
Anesthesia: Topical | Laterality: Left

## 2018-05-05 ENCOUNTER — Ambulatory Visit: Payer: Medicare Other | Admitting: Anesthesiology

## 2018-05-05 ENCOUNTER — Ambulatory Visit
Admission: RE | Admit: 2018-05-05 | Discharge: 2018-05-05 | Disposition: A | Discharge status: Home / Self Care | Payer: Medicare Other | Attending: Ophthalmology | Admitting: Ophthalmology

## 2018-05-05 ENCOUNTER — Encounter
Admission: RE | Disposition: A | Discharge status: Home / Self Care | Payer: Self-pay | Source: Home / Self Care | Attending: Ophthalmology

## 2018-05-05 ENCOUNTER — Other Ambulatory Visit: Payer: Self-pay

## 2018-05-05 DIAGNOSIS — H2512 Age-related nuclear cataract, left eye: Secondary | ICD-10-CM | POA: Diagnosis not present

## 2018-05-05 DIAGNOSIS — Z7984 Long term (current) use of oral hypoglycemic drugs: Secondary | ICD-10-CM | POA: Diagnosis not present

## 2018-05-05 DIAGNOSIS — E1151 Type 2 diabetes mellitus with diabetic peripheral angiopathy without gangrene: Secondary | ICD-10-CM | POA: Insufficient documentation

## 2018-05-05 DIAGNOSIS — Z85828 Personal history of other malignant neoplasm of skin: Secondary | ICD-10-CM | POA: Insufficient documentation

## 2018-05-05 DIAGNOSIS — Z79899 Other long term (current) drug therapy: Secondary | ICD-10-CM | POA: Diagnosis not present

## 2018-05-05 DIAGNOSIS — Z7901 Long term (current) use of anticoagulants: Secondary | ICD-10-CM | POA: Diagnosis not present

## 2018-05-05 DIAGNOSIS — H5703 Miosis: Secondary | ICD-10-CM | POA: Diagnosis not present

## 2018-05-05 DIAGNOSIS — E538 Deficiency of other specified B group vitamins: Secondary | ICD-10-CM | POA: Diagnosis not present

## 2018-05-05 DIAGNOSIS — E1136 Type 2 diabetes mellitus with diabetic cataract: Secondary | ICD-10-CM | POA: Diagnosis present

## 2018-05-05 DIAGNOSIS — Z87891 Personal history of nicotine dependence: Secondary | ICD-10-CM | POA: Diagnosis not present

## 2018-05-05 DIAGNOSIS — M1990 Unspecified osteoarthritis, unspecified site: Secondary | ICD-10-CM | POA: Diagnosis not present

## 2018-05-05 DIAGNOSIS — I10 Essential (primary) hypertension: Secondary | ICD-10-CM | POA: Insufficient documentation

## 2018-05-05 DIAGNOSIS — E785 Hyperlipidemia, unspecified: Secondary | ICD-10-CM | POA: Insufficient documentation

## 2018-05-05 DIAGNOSIS — Z86718 Personal history of other venous thrombosis and embolism: Secondary | ICD-10-CM | POA: Insufficient documentation

## 2018-05-05 DIAGNOSIS — E559 Vitamin D deficiency, unspecified: Secondary | ICD-10-CM | POA: Insufficient documentation

## 2018-05-05 HISTORY — PX: CATARACT EXTRACTION W/PHACO: SHX586

## 2018-05-05 HISTORY — DX: Edema, unspecified: R60.9

## 2018-05-05 LAB — GLUCOSE, CAPILLARY: Glucose-Capillary: 120 mg/dL — ABNORMAL HIGH (ref 70–99)

## 2018-05-05 SURGERY — PHACOEMULSIFICATION, CATARACT, WITH IOL INSERTION
Anesthesia: Monitor Anesthesia Care | Site: Eye | Laterality: Left

## 2018-05-05 MED ORDER — MIDAZOLAM HCL 2 MG/2ML IJ SOLN
INTRAMUSCULAR | Status: AC
  Filled 2018-05-05: qty 2

## 2018-05-05 MED ORDER — TETRACAINE HCL 0.5 % OP SOLN
1.0000 [drp] | OPHTHALMIC | Status: AC | PRN
  Administered 2018-05-05 (×3): 1 [drp] via OPHTHALMIC

## 2018-05-05 MED ORDER — CARBACHOL 0.01 % IO SOLN
INTRAOCULAR | Status: DC | PRN
  Administered 2018-05-05: .5 mL via INTRAOCULAR

## 2018-05-05 MED ORDER — NA HYALUR & NA CHOND-NA HYALUR 0.55-0.5 ML IO KIT
PACK | INTRAOCULAR | Status: AC
  Filled 2018-05-05: qty 1.05

## 2018-05-05 MED ORDER — ARMC OPHTHALMIC DILATING DROPS
1.0000 "application " | OPHTHALMIC | Status: AC
  Administered 2018-05-05 (×3): 1 via OPHTHALMIC

## 2018-05-05 MED ORDER — NEOMYCIN-POLYMYXIN-DEXAMETH 0.1 % OP OINT
TOPICAL_OINTMENT | OPHTHALMIC | Status: DC | PRN
  Administered 2018-05-05: 1 via OPHTHALMIC

## 2018-05-05 MED ORDER — TRYPAN BLUE 0.06 % OP SOLN
OPHTHALMIC | Status: AC
  Filled 2018-05-05: qty 1

## 2018-05-05 MED ORDER — LIDOCAINE HCL (PF) 4 % IJ SOLN
INTRAOCULAR | Status: DC | PRN
  Administered 2018-05-05: 2 mL via OPHTHALMIC

## 2018-05-05 MED ORDER — SODIUM CHLORIDE 0.9 % IV SOLN
INTRAVENOUS | Status: DC
  Administered 2018-05-05: 07:00:00 via INTRAVENOUS

## 2018-05-05 MED ORDER — LIDOCAINE HCL (PF) 4 % IJ SOLN
INTRAMUSCULAR | Status: AC
  Filled 2018-05-05: qty 5

## 2018-05-05 MED ORDER — ARMC OPHTHALMIC DILATING DROPS
OPHTHALMIC | Status: AC
  Administered 2018-05-05: 1 via OPHTHALMIC
  Filled 2018-05-05: qty 0.5

## 2018-05-05 MED ORDER — MOXIFLOXACIN HCL 0.5 % OP SOLN: 1.0000 [drp] | OPHTHALMIC | Status: DC | PRN

## 2018-05-05 MED ORDER — POVIDONE-IODINE 5 % OP SOLN
OPHTHALMIC | Status: AC
  Filled 2018-05-05: qty 30

## 2018-05-05 MED ORDER — MIDAZOLAM HCL 2 MG/2ML IJ SOLN
INTRAMUSCULAR | Status: DC | PRN
  Administered 2018-05-05: 1 mg via INTRAVENOUS

## 2018-05-05 MED ORDER — MOXIFLOXACIN HCL 0.5 % OP SOLN
OPHTHALMIC | Status: AC
  Filled 2018-05-05: qty 3

## 2018-05-05 MED ORDER — TETRACAINE HCL 0.5 % OP SOLN
OPHTHALMIC | Status: AC
  Administered 2018-05-05: 1 [drp] via OPHTHALMIC
  Filled 2018-05-05: qty 4

## 2018-05-05 MED ORDER — ACETYLCHOLINE CHLORIDE 20 MG IO SOLR
INTRAOCULAR | Status: DC | PRN
  Administered 2018-05-05: 5 mg via INTRAOCULAR

## 2018-05-05 MED ORDER — POVIDONE-IODINE 5 % OP SOLN
OPHTHALMIC | Status: DC | PRN
  Administered 2018-05-05: 1 via OPHTHALMIC

## 2018-05-05 MED ORDER — FENTANYL CITRATE (PF) 100 MCG/2ML IJ SOLN
INTRAMUSCULAR | Status: AC
  Filled 2018-05-05: qty 2

## 2018-05-05 MED ORDER — EPINEPHRINE PF 1 MG/ML IJ SOLN
INTRAMUSCULAR | Status: AC
  Filled 2018-05-05: qty 1

## 2018-05-05 MED ORDER — FENTANYL CITRATE (PF) 100 MCG/2ML IJ SOLN
INTRAMUSCULAR | Status: DC | PRN
  Administered 2018-05-05: 50 ug via INTRAVENOUS

## 2018-05-05 MED ORDER — NA HYALUR & NA CHOND-NA HYALUR 0.4-0.35 ML IO KIT
PACK | INTRAOCULAR | Status: DC | PRN
  Administered 2018-05-05: 1 mL via INTRAOCULAR

## 2018-05-05 MED ORDER — NEOMYCIN-POLYMYXIN-DEXAMETH 3.5-10000-0.1 OP OINT
TOPICAL_OINTMENT | OPHTHALMIC | Status: AC
  Filled 2018-05-05: qty 3.5

## 2018-05-05 MED ORDER — EPINEPHRINE PF 1 MG/ML IJ SOLN
INTRAOCULAR | Status: DC | PRN
  Administered 2018-05-05: 1 mL via OPHTHALMIC

## 2018-05-05 SURGICAL SUPPLY — 19 items
GLOVE BIO SURGEON STRL SZ8 (GLOVE) ×3 IMPLANT
GLOVE BIOGEL M 6.5 STRL (GLOVE) ×3 IMPLANT
GLOVE SURG LX 7.5 STRW (GLOVE) ×2
GLOVE SURG LX STRL 7.5 STRW (GLOVE) ×1 IMPLANT
GOWN STRL REUS W/ TWL LRG LVL3 (GOWN DISPOSABLE) ×2 IMPLANT
GOWN STRL REUS W/TWL LRG LVL3 (GOWN DISPOSABLE) ×4
LABEL CATARACT MEDS ST (LABEL) ×3 IMPLANT
LENS IOL TECNIS ITEC 23.5 (Intraocular Lens) ×3 IMPLANT
NDL HPO THNWL 1X22GA REG BVL (NEEDLE) ×1 IMPLANT
NEEDLE SAFETY 22GX1 (NEEDLE) ×2
PACK CATARACT (MISCELLANEOUS) ×3 IMPLANT
PACK CATARACT BRASINGTON LX (MISCELLANEOUS) ×3 IMPLANT
PACK EYE AFTER SURG (MISCELLANEOUS) ×3 IMPLANT
RING MALYGIN 7.0 (MISCELLANEOUS) ×3 IMPLANT
SOL BSS BAG (MISCELLANEOUS) ×3
SOLUTION BSS BAG (MISCELLANEOUS) ×1 IMPLANT
SYR 5ML LL (SYRINGE) ×3 IMPLANT
WATER STERILE IRR 250ML POUR (IV SOLUTION) ×3 IMPLANT
WIPE NON LINTING 3.25X3.25 (MISCELLANEOUS) ×3 IMPLANT

## 2018-05-05 NOTE — Anesthesia Postprocedure Evaluation (Signed)
Anesthesia Post Note  Patient: RYLEI CODISPOTI  Procedure(s) Performed: CATARACT EXTRACTION PHACO AND INTRAOCULAR LENS PLACEMENT (IOC) LEFT, DIABETIC (Left Eye)  Patient location during evaluation: PACU Anesthesia Type: MAC Level of consciousness: awake and alert and oriented Pain management: pain level controlled Vital Signs Assessment: post-procedure vital signs reviewed and stable Respiratory status: spontaneous breathing, nonlabored ventilation and respiratory function stable Cardiovascular status: blood pressure returned to baseline and stable Postop Assessment: no signs of nausea or vomiting Anesthetic complications: no     Last Vitals:  Vitals:   05/05/18 0628 05/05/18 0759  BP: (!) 143/83 117/70  Pulse: 76 69  Resp: 16 16  Temp: (!) 35.9 C 36.6 C  SpO2: 98% 96%    Last Pain:  Vitals:   05/05/18 0759  TempSrc: Oral  PainSc: 0-No pain                 Janean Eischen

## 2018-05-05 NOTE — H&P (Signed)

## 2018-05-05 NOTE — Anesthesia Post-op Follow-up Note (Signed)
Anesthesia QCDR form completed.        

## 2018-05-05 NOTE — Discharge Instructions (Addendum)
Eye Surgery Discharge Instructions  Expect mild scratchy sensation or mild soreness. DO NOT RUB YOUR EYE!  The day of surgery:  Minimal physical activity, but bed rest is not required  No reading, computer work, or close hand work  No bending, lifting, or straining.  May watch TV  For 24 hours:  No driving, legal decisions, or alcoholic beverages  Safety precautions  Eat anything you prefer: It is better to start with liquids, then soup then solid foods.  Solar shield eyeglasses should be worn for comfort in the sunlight/patch while sleeping  Resume all regular medications including aspirin or Coumadin if these were discontinued prior to surgery. You may shower, bathe, shave, or wash your hair. Tylenol may be taken for mild discomfort. Follow Dr. Larrie Kass eye drop instruction sheet as reviewed.  Call your doctor if you experience significant pain, nausea, or vomiting, fever > 101 or other signs of infection. [phone] or [phone] Specific instructions:  Follow-up Information    Leandrew Koyanagi, MD Follow up.   Specialty:  Ophthalmology Why:  05/06/18 @ 11:05 am Contact information: [address]   [place] [address] [phone]

## 2018-05-05 NOTE — Transfer of Care (Signed)
Immediate Anesthesia Transfer of Care Note  Patient: Vincent Dawson  Procedure(s) Performed: CATARACT EXTRACTION PHACO AND INTRAOCULAR LENS PLACEMENT (IOC) LEFT, DIABETIC (Left Eye)  Patient Location: PACU and Short Stay  Anesthesia Type:MAC  Level of Consciousness: awake, alert  and oriented  Airway & Oxygen Therapy: Patient Spontanous Breathing  Post-op Assessment: Report given to RN and Post -op Vital signs reviewed and stable  Post vital signs: Reviewed and stable  Last Vitals:  Vitals Value Taken Time  BP    Temp    Pulse    Resp    SpO2      Last Pain:  Vitals:   05/05/18 0628  TempSrc: Tympanic  PainSc: 0-No pain         Complications: No apparent anesthesia complications

## 2018-05-05 NOTE — Op Note (Signed)
OPERATIVE NOTE  Vincent Dawson [id number] 05/05/2018  PREOPERATIVE DIAGNOSIS:   Nuclear sclerotic cataract left eye with miotic pupil      H25.12   POSTOPERATIVE DIAGNOSIS:   Nuclear sclerotic cataract left eye with miotic pupil.     PROCEDURE:  Phacoemulsification with posterior chamber intraocular lens implantation of the left eye which required pupil stretching with the Malyugin pupil expansion device   LENS:   Implant Name Type Inv. Item Serial No. Manufacturer Lot No. LRB No. Used  LENS IOL DIOP 23.5 - [id number] Intraocular Lens LENS IOL DIOP 23.5 [id number] AMO  Left 1        ULTRASOUND TIME: 15 % of 0 minutes, 54 seconds.  CDE 8.0   SURGEON:  Wyonia Hough, MD   ANESTHESIA: Topical with tetracaine drops and 2% Xylocaine jelly, augmented with 1% preservative-free intracameral lidocaine.   COMPLICATIONS:  None.   DESCRIPTION OF PROCEDURE:  The patient was identified in the holding room and transported to the operating room and placed in the supine position under the operating microscope.  The left eye was identified as the operative eye and it was prepped and draped in the usual sterile ophthalmic fashion.   A 1 millimeter clear-corneal paracentesis was made at the 1:30 position.  The anterior chamber was filled with Viscoat viscoelastic.  0.5 ml of preservative-free 1% lidocaine was injected into the anterior chamber.  A 2.4 millimeter keratome was used to make a near-clear corneal incision at the 10:30 position.  A Malyugin pupil expander was then placed through the main incision and into the anterior chamber of the eye.  The edge of the iris was secured on the lip of the pupil expander and it was released, thereby expanding the pupil to approximately 7 millimeters for completion of the cataract surgery.  Additional Viscoat was placed in the anterior chamber.  A cystotome and capsulorrhexis forceps were used to make a curvilinear capsulorrhexis.   Balanced salt solution  was used to hydrodissect and hydrodelineate the lens nucleus.   Phacoemulsification was used in stop and chop fashion to remove the lens, nucleus and epinucleus.  The remaining cortex was aspirated using the irrigation aspiration handpiece.  Additional Provisc was placed into the eye to distend the capsular bag for lens placement.  A lens was then injected into the capsular bag.  The pupil expanding ring was removed using a Kuglen hook and insertion device. The remaining viscoelastic was aspirated from the capsular bag and the anterior chamber.  The anterior chamber was filled with balanced salt solution to inflate to a physiologic pressure.   Wounds were hydrated with balanced salt solution.  The anterior chamber was inflated to a physiologic pressure with balanced salt solution.  No wound leaks were noted. Miostat was placed into the anterior chamber.  Vigamox 0.2 ml of a 1mg  per ml solution was injected into the anterior chamber for a dose of 0.2 mg of intracameral antibiotic at the completion of the case.  The patient was taken to the recovery room in stable condition without complications of anesthesia or surgery.  [name] 05/05/2018, 7:58 AM 2

## 2018-05-05 NOTE — Anesthesia Preprocedure Evaluation (Signed)
Anesthesia Evaluation  Patient identified by MRN, date of birth, ID band Patient awake    Reviewed: Allergy & Precautions, NPO status , Patient's Chart, lab work & pertinent test results  History of Anesthesia Complications Negative for: history of anesthetic complications  Airway Mallampati: III  TM Distance: >3 FB Neck ROM: Full    Dental  (+) Poor Dentition   Pulmonary neg pulmonary ROS, neg sleep apnea, neg COPD,    breath sounds clear to auscultation- rhonchi (-) wheezing      Cardiovascular hypertension, Pt. on medications (-) CAD, (-) Past MI, (-) Cardiac Stents and (-) CABG  Rhythm:Regular Rate:Normal - Systolic murmurs and - Diastolic murmurs    Neuro/Psych neg Seizures CVA (eye) negative psych ROS   GI/Hepatic Neg liver ROS, PUD,   Endo/Other  diabetes, Oral Hypoglycemic Agents  Renal/GU negative Renal ROS     Musculoskeletal  (+) Arthritis ,   Abdominal (+) - obese,   Peds  Hematology negative hematology ROS (+)   Anesthesia Other Findings Past Medical History: No date: Arthritis No date: Cancer (HCC)     Comment:  Basil Cell Carcinoma No date: Colon polyp No date: Diabetes mellitus without complication (HCC) No date: Duodenitis No date: DVT (deep vein thrombosis) in pregnancy No date: Edema     Comment:  FEET/ LEGS No date: Erectile dysfunction No date: Erosive esophagitis No date: Gastritis, chronic No date: Glaucoma No date: Gout No date: Hyperlipidemia No date: Hypertension No date: Peripheral vascular disease (HCC) No date: Rib fractures No date: Stroke, small vessel (HCC)     Comment:  right eye No date: Vitamin B 12 deficiency   Reproductive/Obstetrics                             Anesthesia Physical Anesthesia Plan  ASA: III  Anesthesia Plan: MAC   Post-op Pain Management:    Induction: Intravenous  PONV Risk Score and Plan: 1 and  Midazolam  Airway Management Planned: Natural Airway  Additional Equipment:   Intra-op Plan:   Post-operative Plan:   Informed Consent: I have reviewed the patients History and Physical, chart, labs and discussed the procedure including the risks, benefits and alternatives for the proposed anesthesia with the patient or authorized representative who has indicated his/her understanding and acceptance.       Plan Discussed with: CRNA and Anesthesiologist  Anesthesia Plan Comments:         Anesthesia Quick Evaluation

## 2019-08-15 ENCOUNTER — Other Ambulatory Visit: Payer: Self-pay | Admitting: Rehabilitative and Restorative Service Providers"

## 2019-08-16 ENCOUNTER — Other Ambulatory Visit: Payer: Self-pay | Admitting: Rehabilitative and Restorative Service Providers"

## 2019-08-16 ENCOUNTER — Telehealth: Payer: Self-pay | Admitting: Rehabilitative and Restorative Service Providers"

## 2019-08-16 DIAGNOSIS — I712 Thoracic aortic aneurysm, without rupture, unspecified: Secondary | ICD-10-CM

## 2019-08-16 NOTE — Telephone Encounter (Signed)
08/16/19~Hm # has no VM/Mobile VM FULL. MF

## 2020-07-08 DEATH — deceased
